# Patient Record
Sex: Female | Born: 1975 | Race: White | Hispanic: No | Marital: Married
Health system: Southern US, Community
[De-identification: ages and names within clinical notes are randomized; demographics above are authoritative.]

---

## 2007-08-02 ENCOUNTER — Emergency Department (HOSPITAL_COMMUNITY): Admission: EM | Admit: 2007-08-02 | Discharge: 2007-08-02 | Payer: Self-pay | Admitting: Family Medicine

## 2008-07-27 ENCOUNTER — Ambulatory Visit (HOSPITAL_COMMUNITY): Admission: RE | Admit: 2008-07-27 | Discharge: 2008-07-27 | Payer: Self-pay | Admitting: Family Medicine

## 2008-08-05 ENCOUNTER — Emergency Department (HOSPITAL_COMMUNITY): Admission: EM | Admit: 2008-08-05 | Discharge: 2008-08-05 | Payer: Self-pay | Admitting: Family Medicine

## 2008-09-24 ENCOUNTER — Ambulatory Visit (HOSPITAL_COMMUNITY): Admission: RE | Admit: 2008-09-24 | Discharge: 2008-09-24 | Payer: Self-pay | Admitting: Family Medicine

## 2008-12-17 ENCOUNTER — Ambulatory Visit: Payer: Self-pay | Admitting: Advanced Practice Midwife

## 2008-12-17 ENCOUNTER — Inpatient Hospital Stay (HOSPITAL_COMMUNITY): Admission: AD | Admit: 2008-12-17 | Discharge: 2008-12-18 | Payer: Self-pay | Admitting: Obstetrics & Gynecology

## 2008-12-27 ENCOUNTER — Inpatient Hospital Stay (HOSPITAL_COMMUNITY): Admission: AD | Admit: 2008-12-27 | Discharge: 2008-12-27 | Payer: Self-pay | Admitting: Obstetrics & Gynecology

## 2008-12-30 ENCOUNTER — Encounter: Payer: Self-pay | Admitting: Family Medicine

## 2008-12-30 LAB — CONVERTED CEMR LAB: Protein, Ur: 96 mg/24hr (ref 50–100)

## 2009-01-03 ENCOUNTER — Encounter: Payer: Self-pay | Admitting: Family Medicine

## 2009-01-06 ENCOUNTER — Ambulatory Visit (HOSPITAL_COMMUNITY): Admission: RE | Admit: 2009-01-06 | Discharge: 2009-01-06 | Payer: Self-pay | Admitting: Family Medicine

## 2009-02-06 ENCOUNTER — Ambulatory Visit: Payer: Self-pay | Admitting: Obstetrics & Gynecology

## 2009-02-06 ENCOUNTER — Inpatient Hospital Stay (HOSPITAL_COMMUNITY): Admission: AD | Admit: 2009-02-06 | Discharge: 2009-02-09 | Payer: Self-pay | Admitting: Family Medicine

## 2009-08-04 IMAGING — US US OB COMP LESS 14 WK
1 series · 14 of 23 positions shown · non-contrast
Comparison: none

OBSTETRICAL ULTRASOUND:
 This ultrasound exam was performed in the [HOSPITAL] Ultrasound Department.  The OB US report was generated in the AS system, and faxed to the ordering physician.  This report is also available in [REDACTED] PACS.

[Series 1: us ob comp less 14 wks · 14 of 23 slices shown]
[im 1/23]
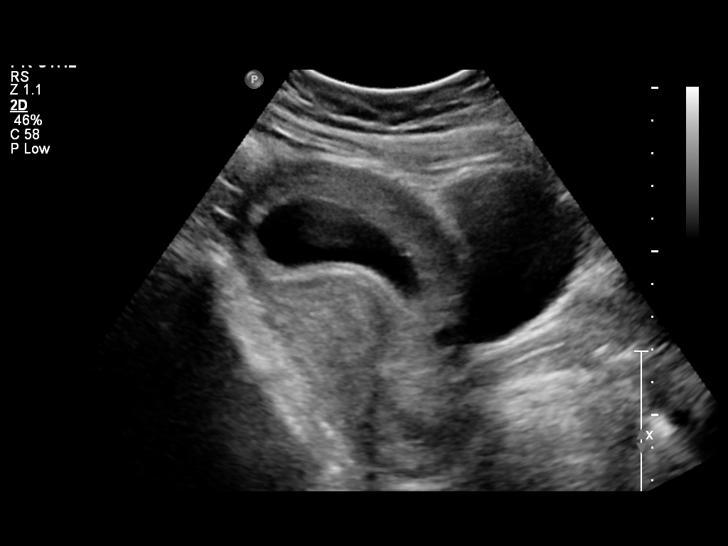
[im 3/23]
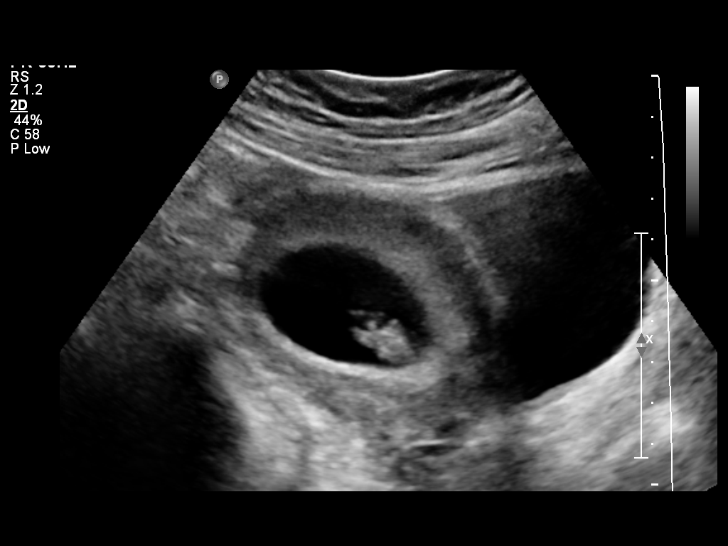
[im 5/23]
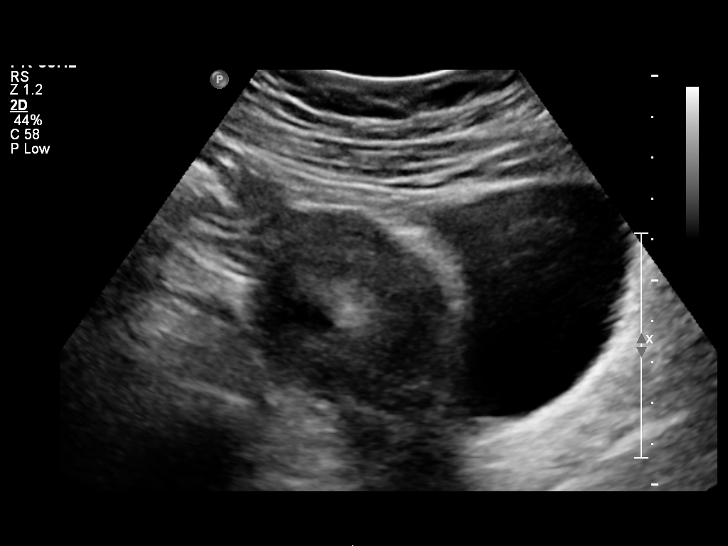
[im 6/23]
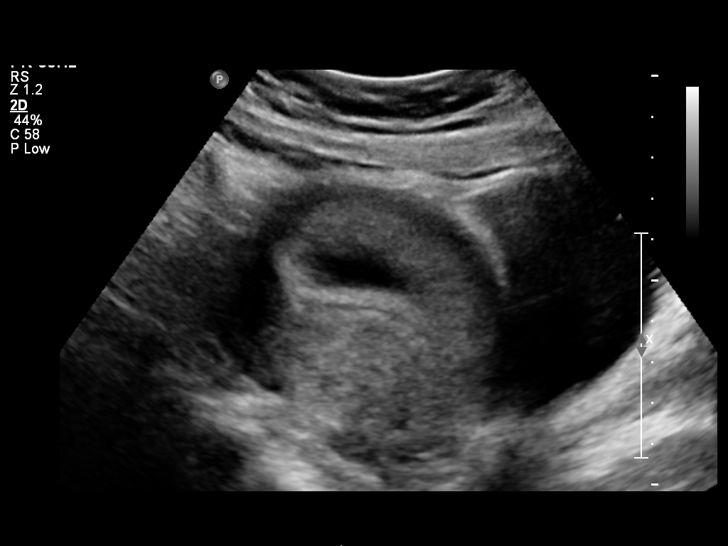
[im 8/23]
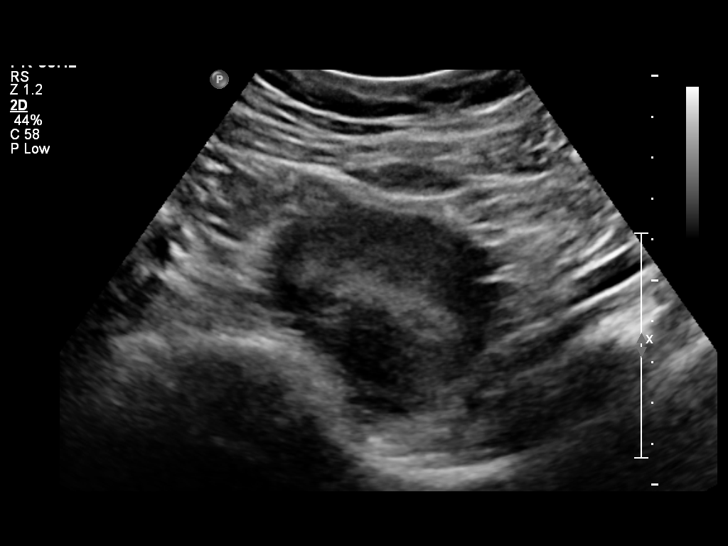
[im 10/23]
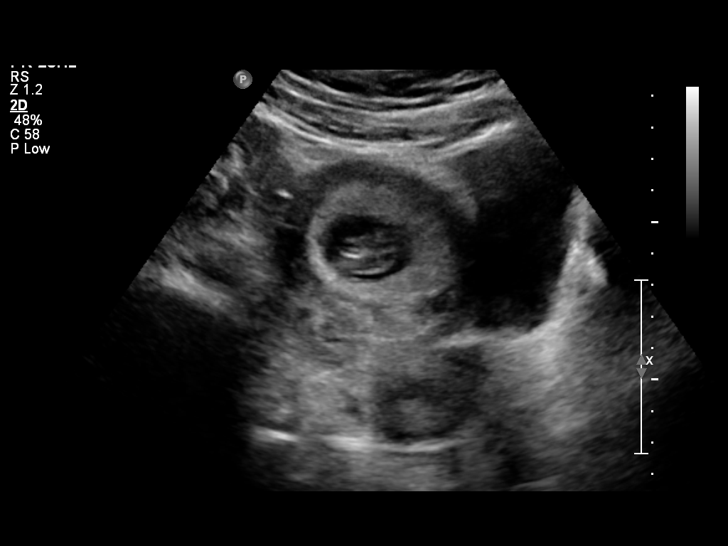
[im 11/23]
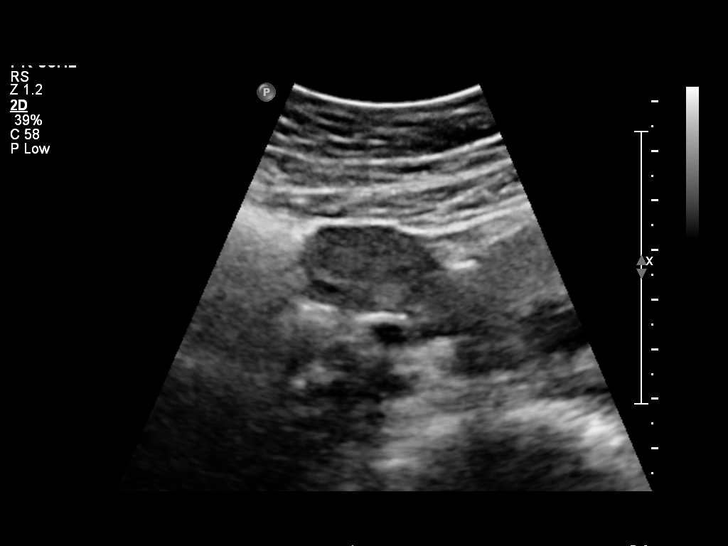
[im 13/23]
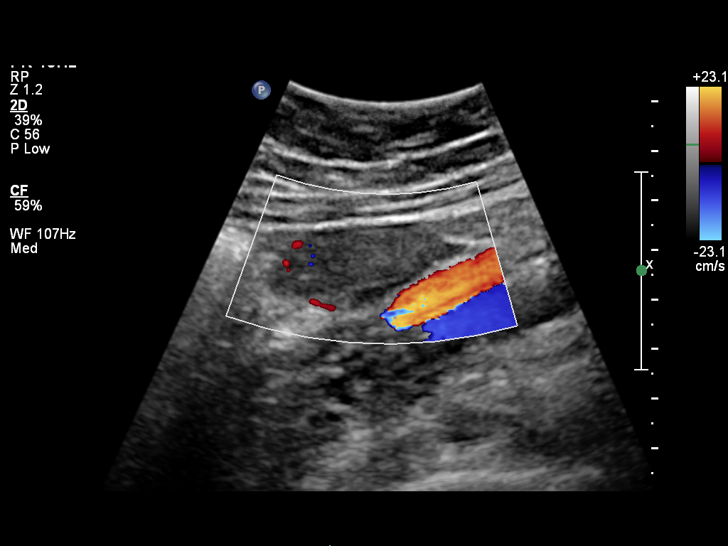
[im 14/23]
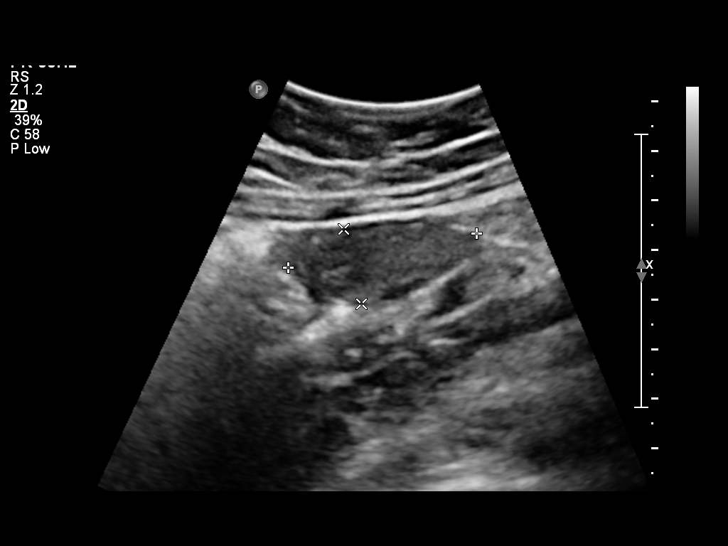
[im 16/23]
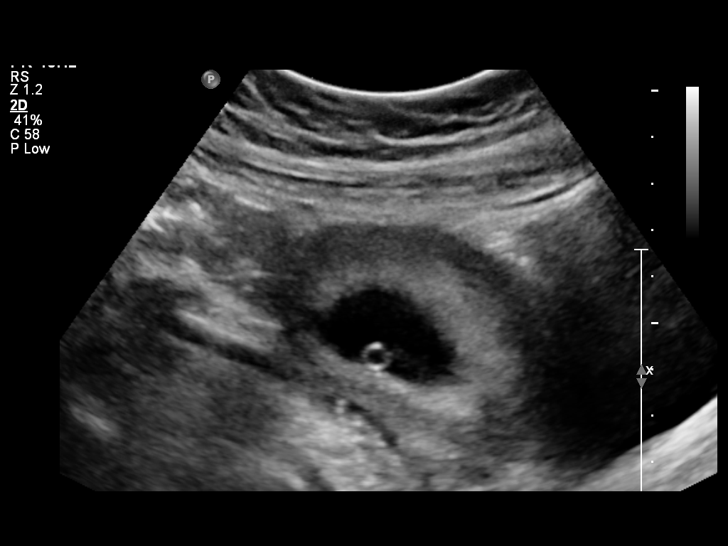
[im 18/23]
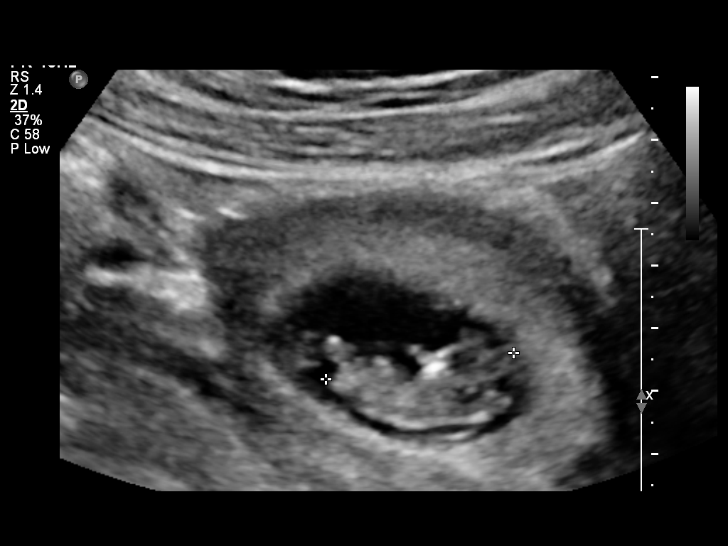
[im 19/23]
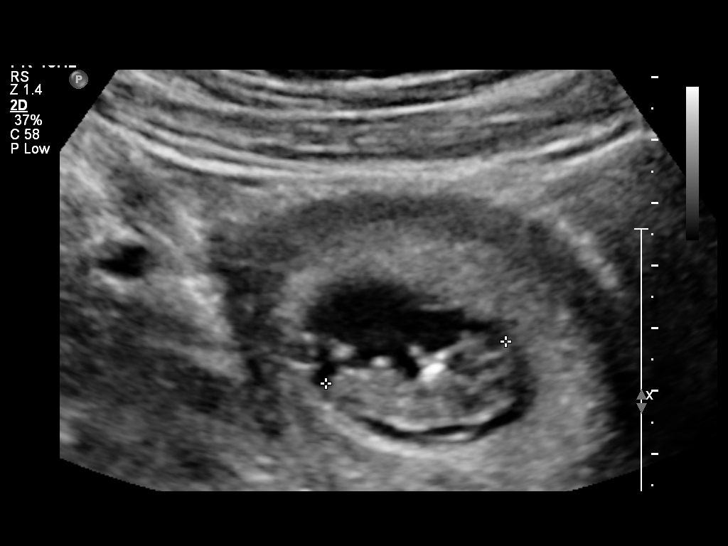
[im 21/23]
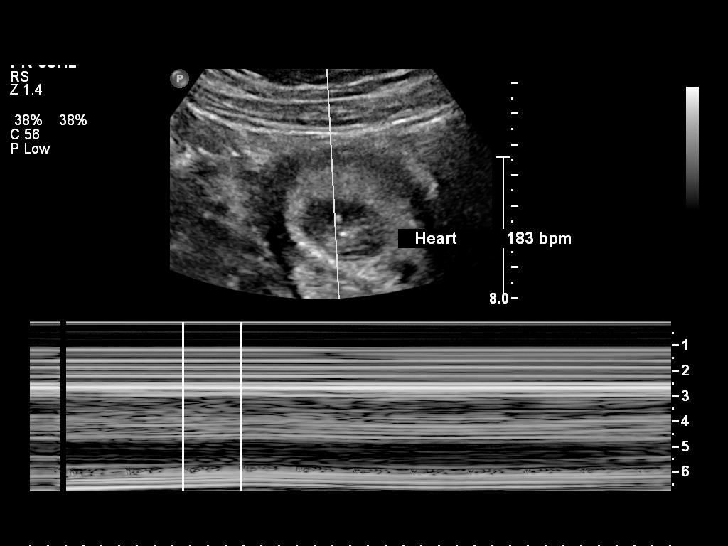
[im 23/23]
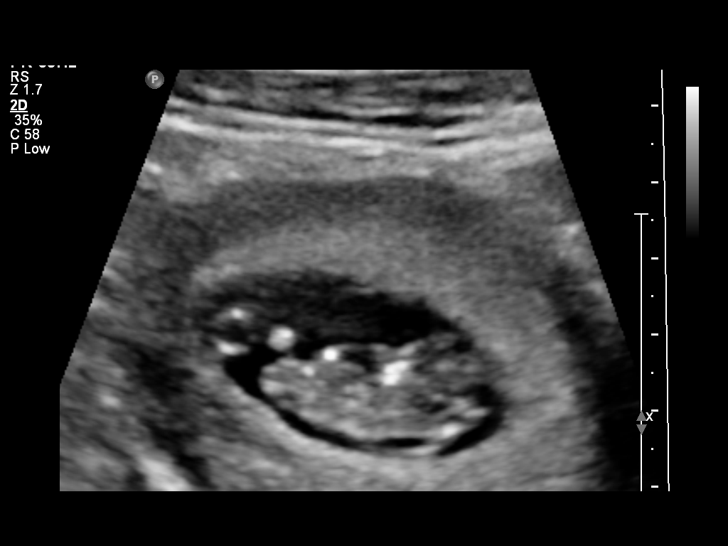

[14 of 23 positions shown; findings below may reference images not displayed]

IMPRESSION: See AS Obstetric US report.

## 2009-10-02 IMAGING — US US OB DETAIL+14 WK
2 of 3 series · 14 of 28 positions shown · non-contrast
Comparison: none

OBSTETRICAL ULTRASOUND:
 This ultrasound exam was performed in the [HOSPITAL] Ultrasound Department.  The OB US report was generated in the AS system, and faxed to the ordering physician.  This report is also available in [REDACTED] PACS.

[Series 1: us ob detail +14 wk · 1 of 5 slices shown (1 of 2)]
[im 5/5]
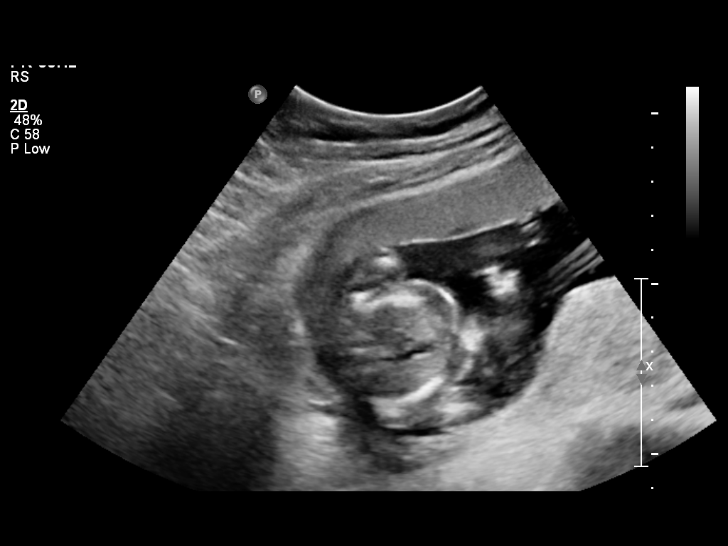

[Series 1: us ob detail +14 wk · 13 of 83 slices shown (2 of 2)]
[im 1/83]
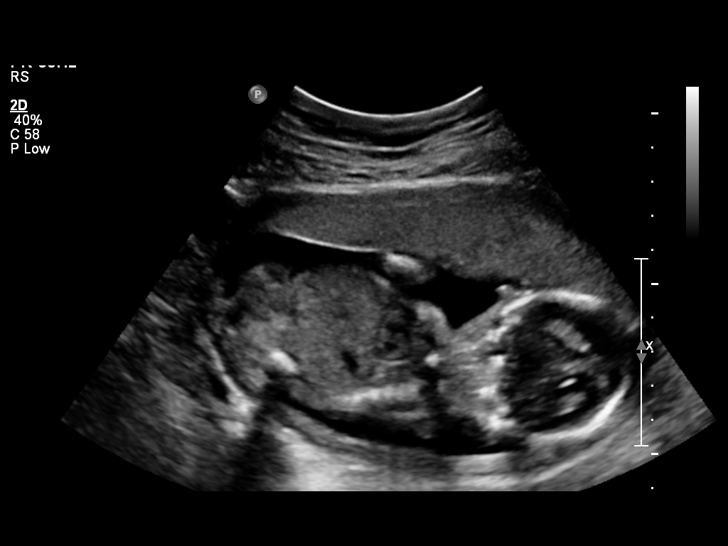
[im 7/83]
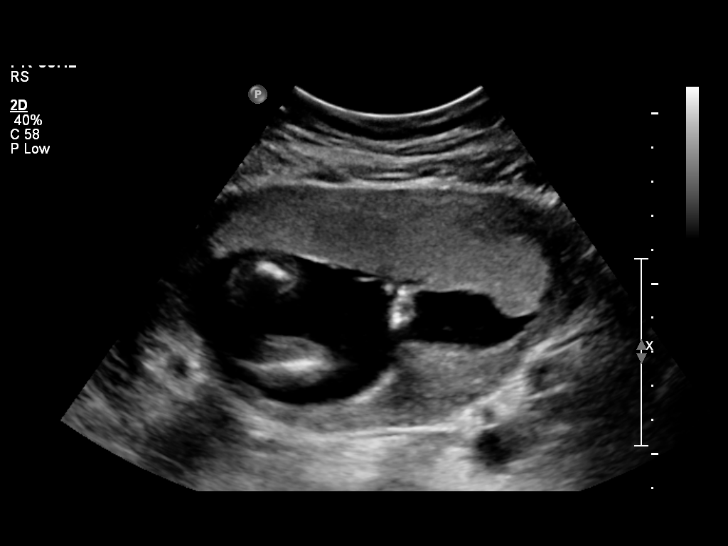
[im 14/83]
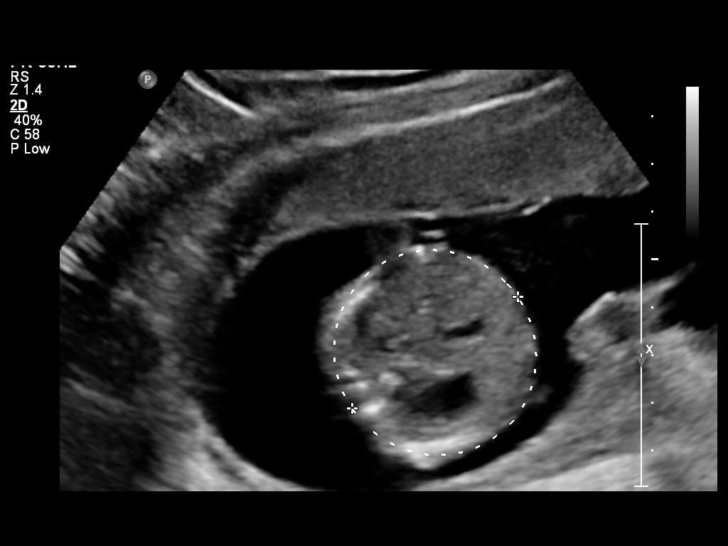
[im 21/83]
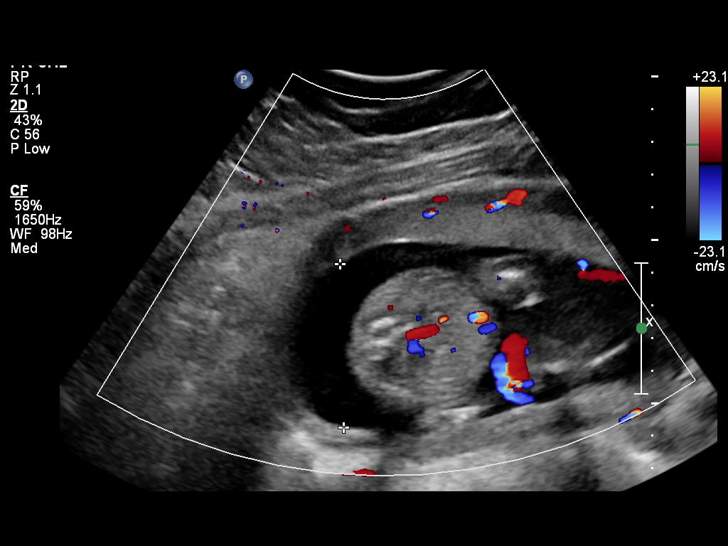
[im 28/83]
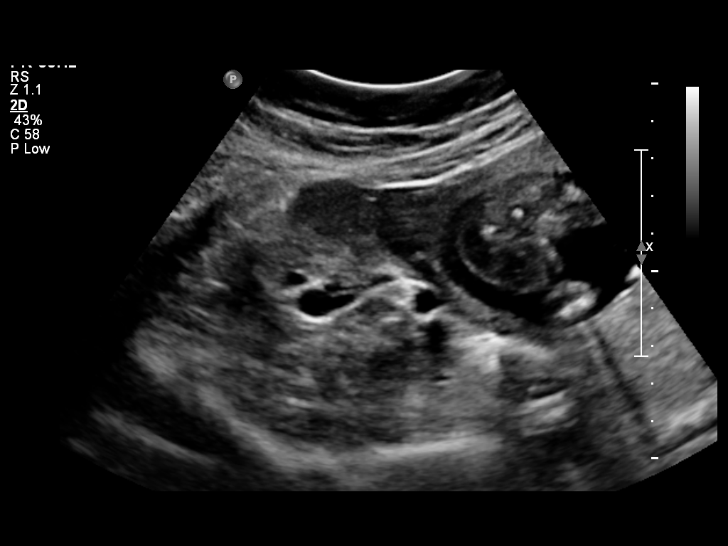
[im 35/83]
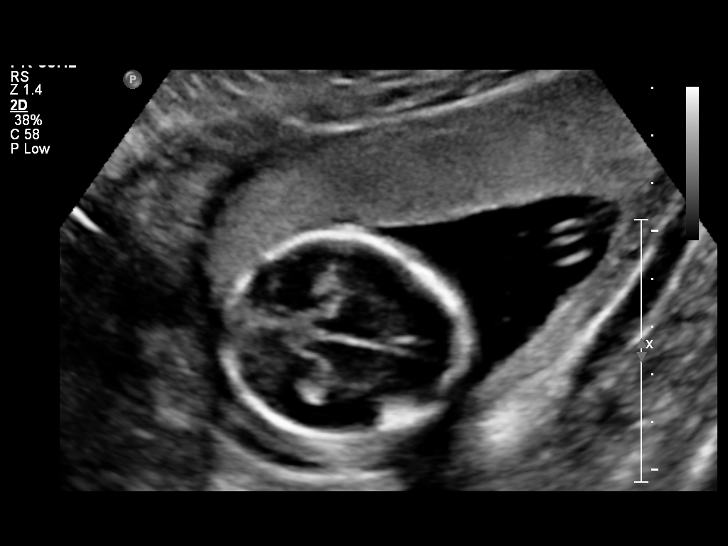
[im 42/83]
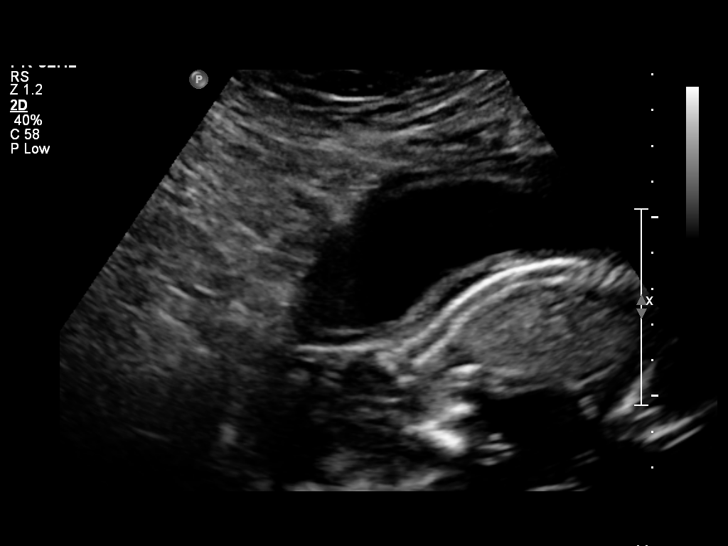
[im 48/83]
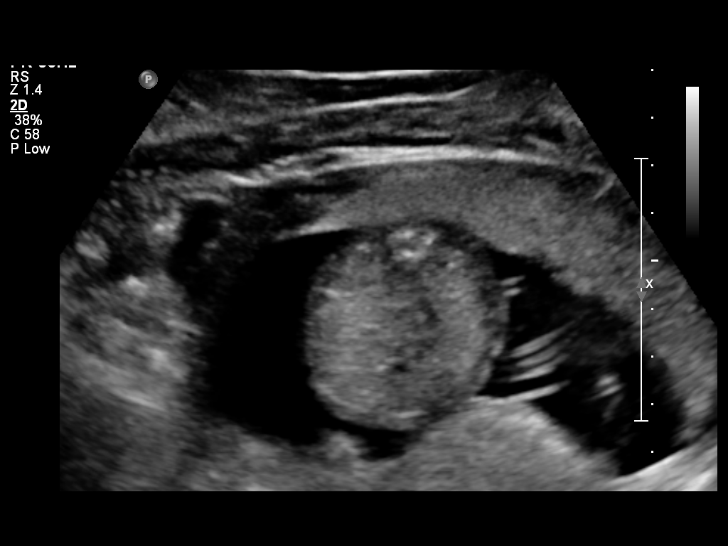
[im 55/83]
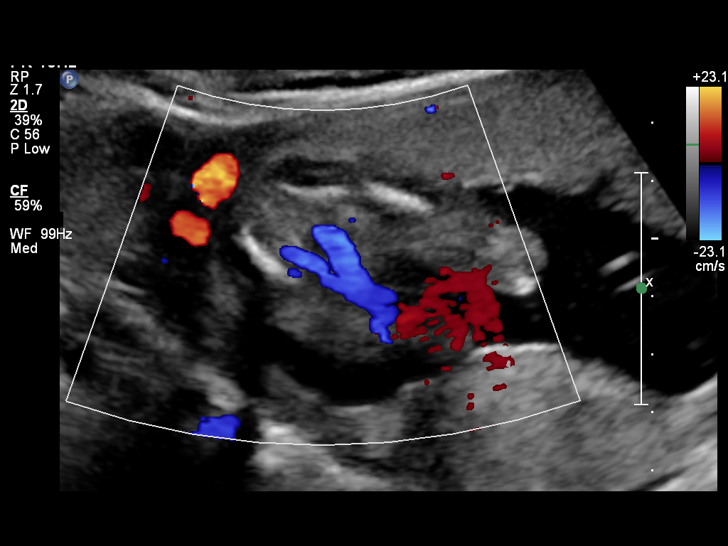
[im 62/83]
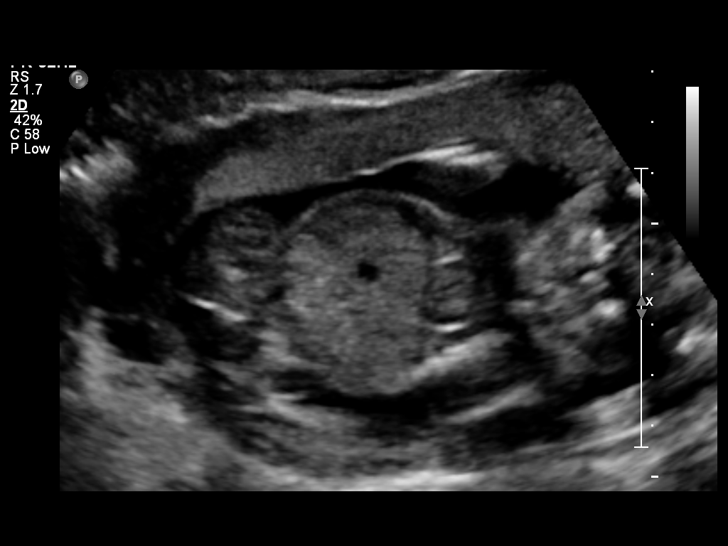
[im 69/83]
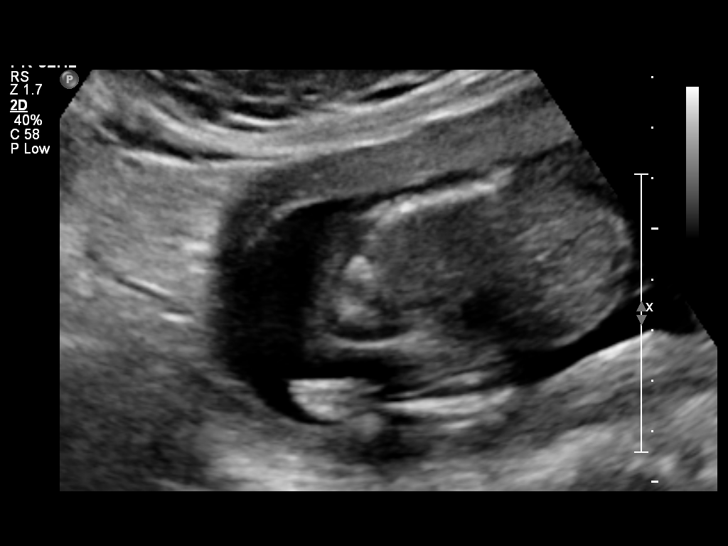
[im 76/83]
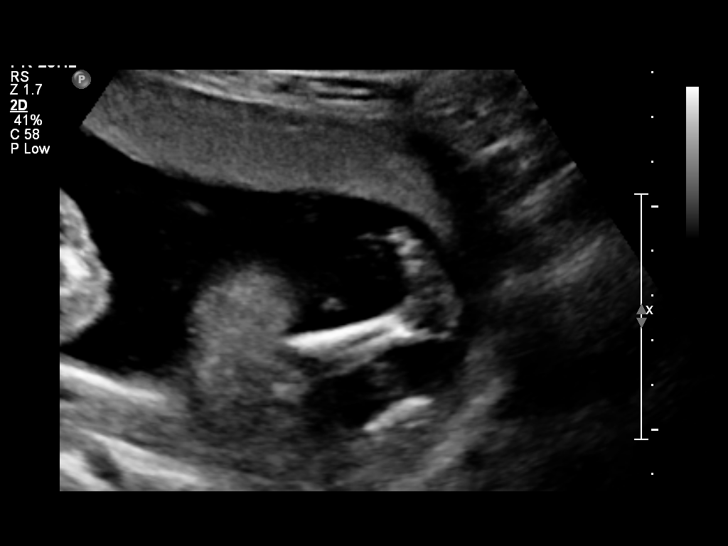
[im 83/83]
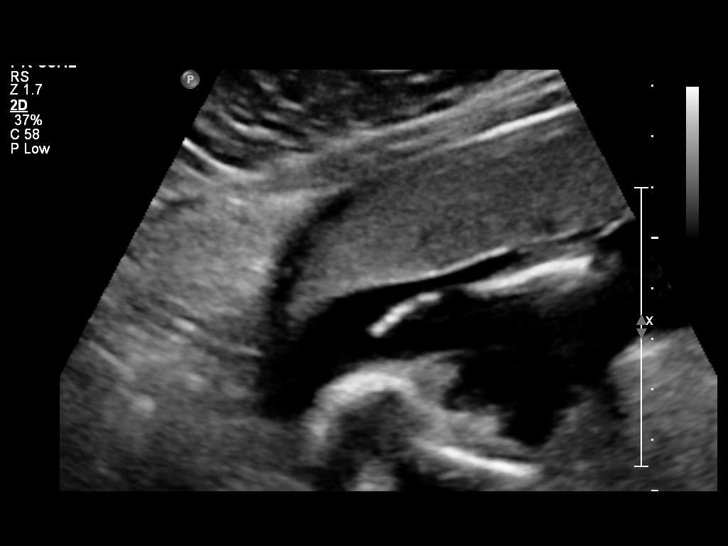

[14 of 28 positions shown; findings below may reference images not displayed]

IMPRESSION: See AS Obstetric US report.

## 2009-12-25 IMAGING — US US FETAL BPP W/O NONSTRESS
1 series · 14 of 17 positions shown · non-contrast
Comparison: none

OBSTETRICAL ULTRASOUND:
 This ultrasound exam was performed in the [HOSPITAL] Ultrasound Department.  The OB US report was generated in the AS system, and faxed to the ordering physician.  This report is also available in [REDACTED] PACS.

[Series 1: us fetal bpp w/o nonstress · non-contrast · 0.24mm/px · 17 acquisitions, 14 frames shown]
[im 1/17]
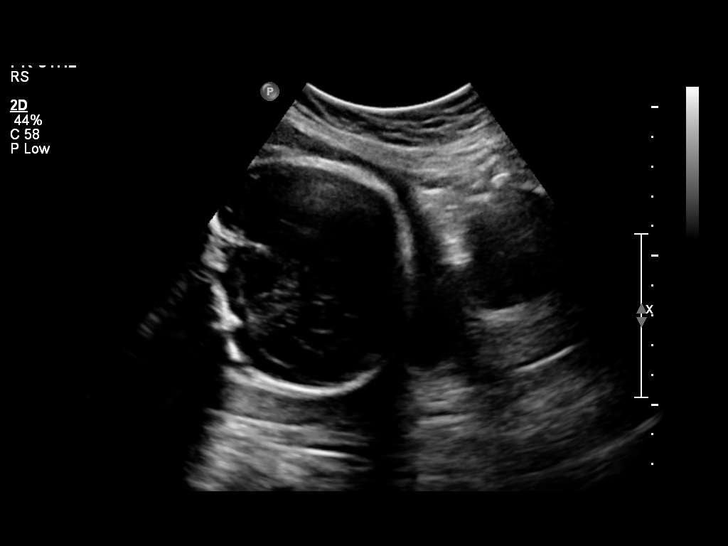
[im 2/17]
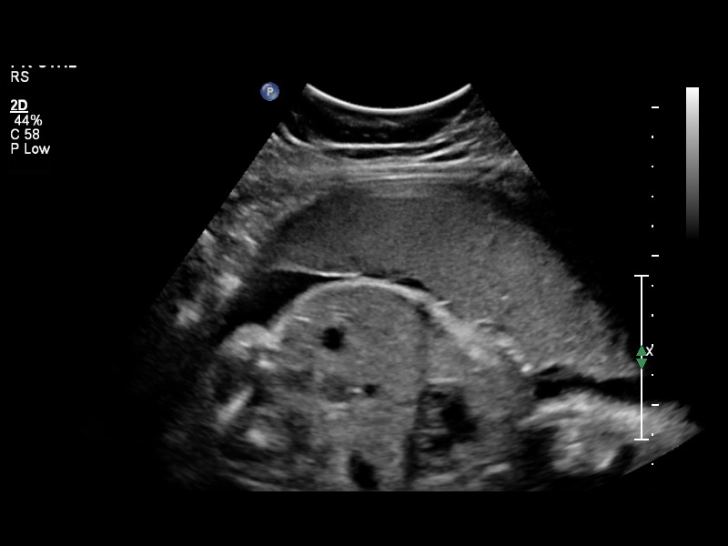
[im 4/17]
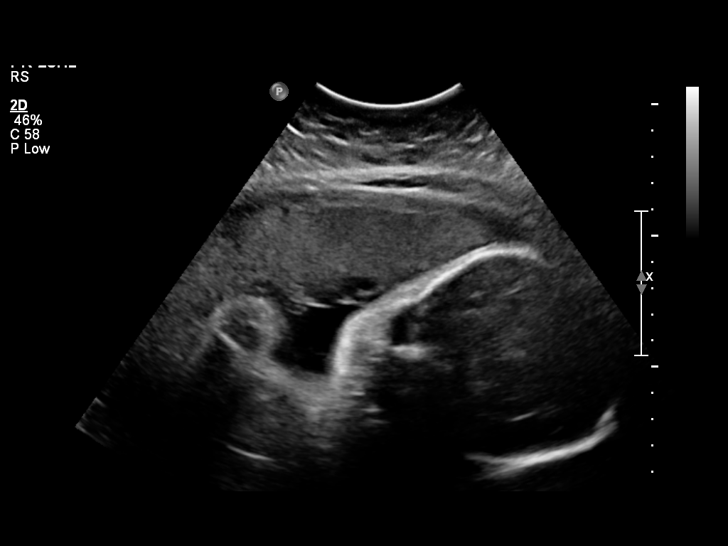
[im 5/17]
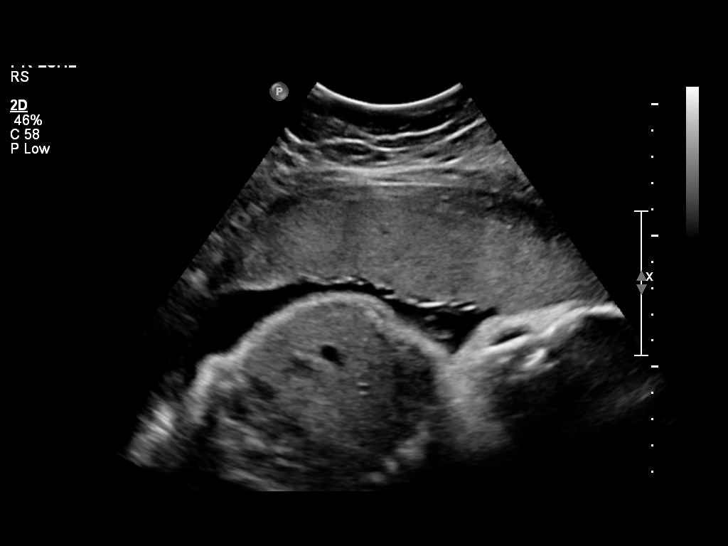
[im 6/17]
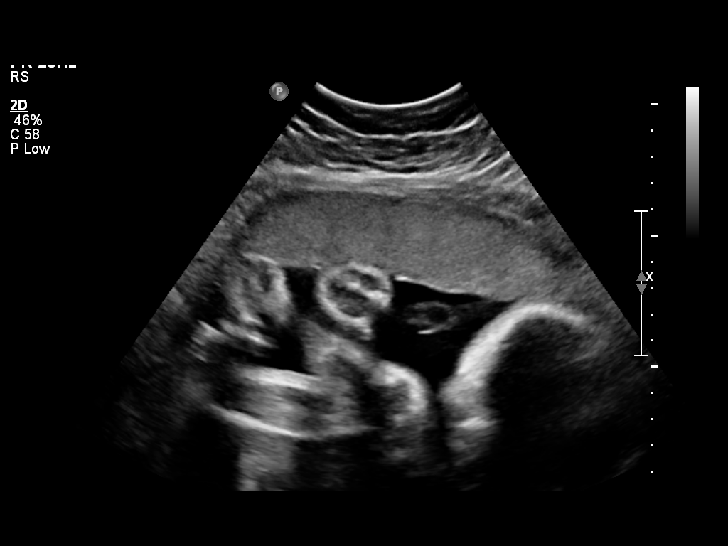
[im 7/17]
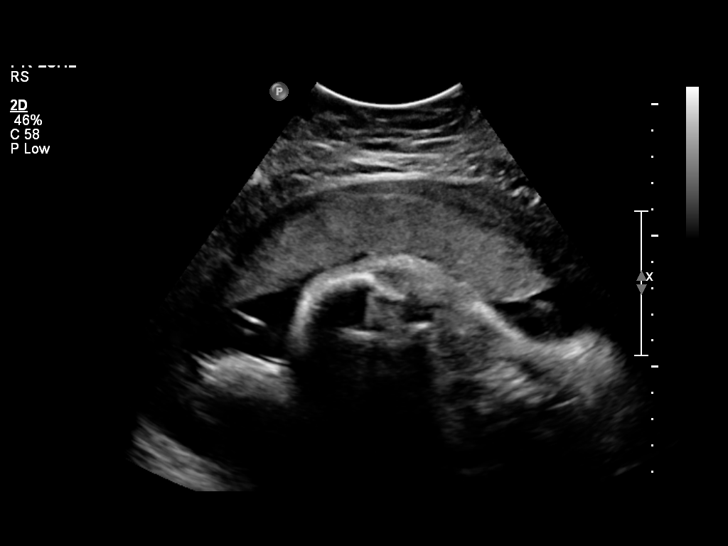
[im 8/17]
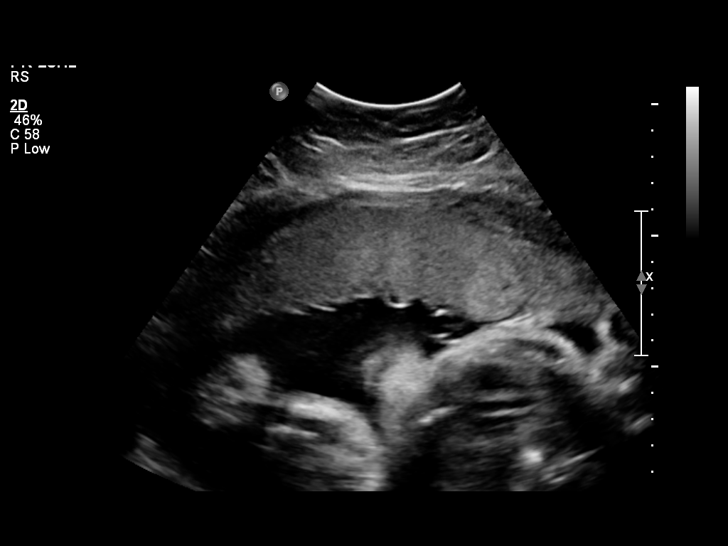
[im 10/17]
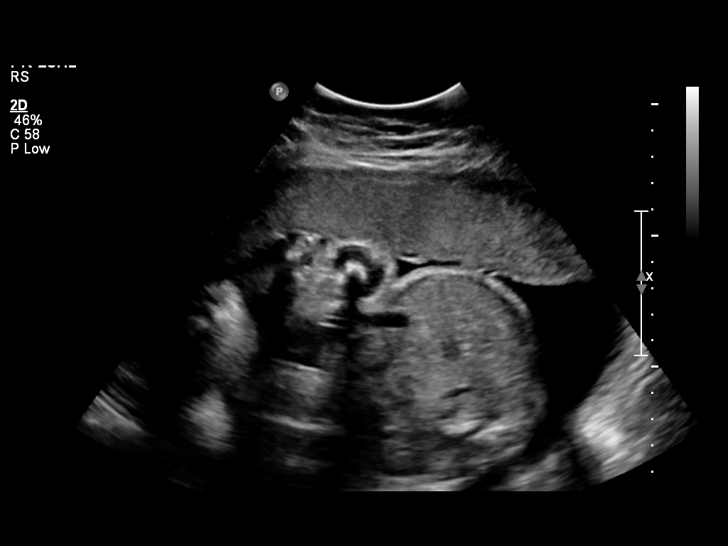
[im 11/17]
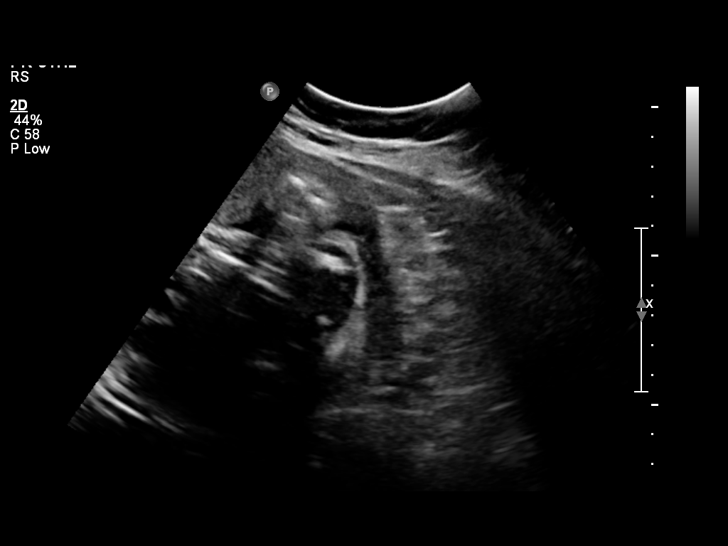
[im 12/17]
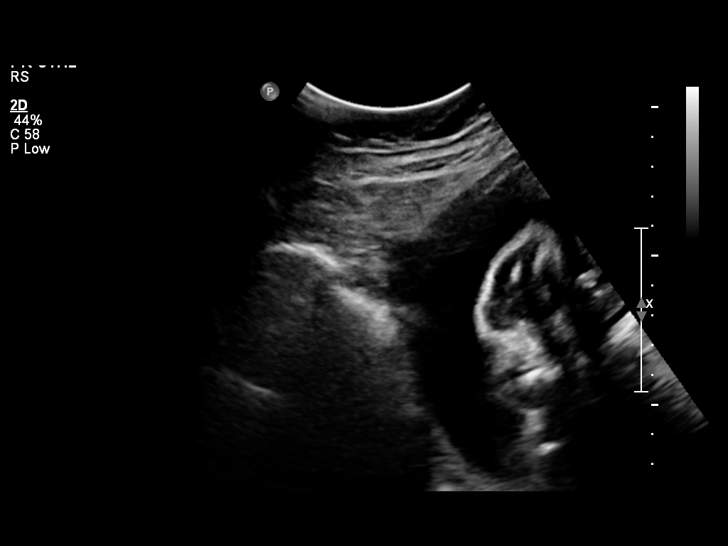
[im 13/17]
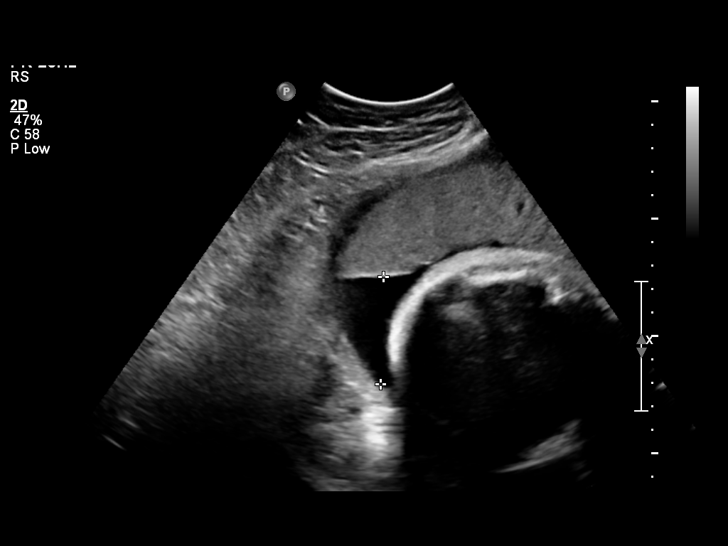
[im 14/17]
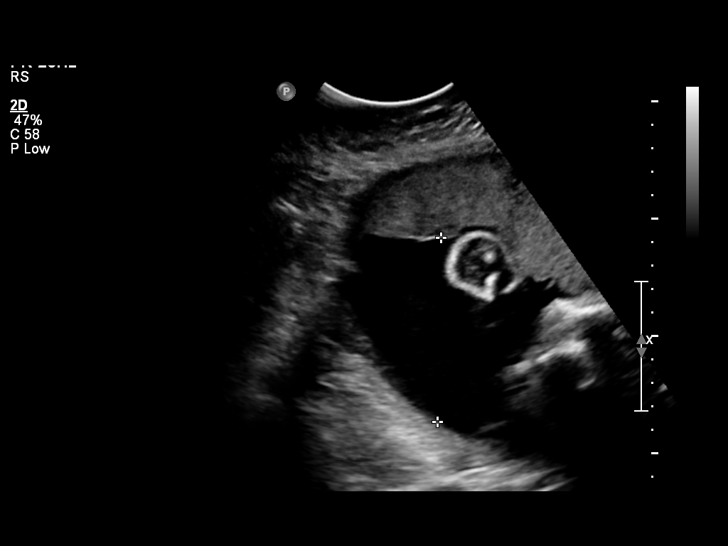
[im 16/17]
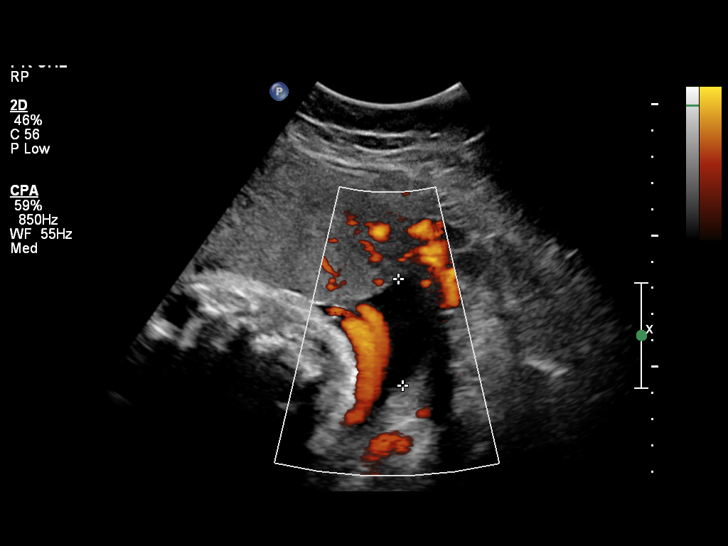
[im 17/17]
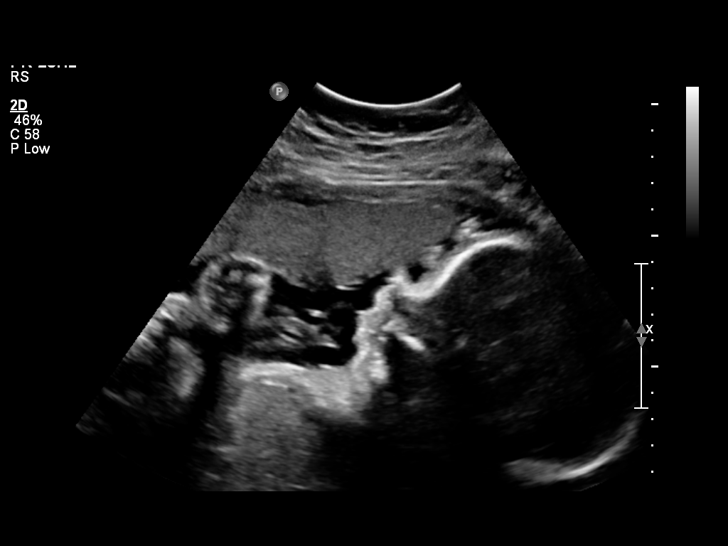

[14 of 17 positions shown; findings below may reference images not displayed]

IMPRESSION: See AS Obstetric US report.

## 2010-09-23 LAB — CBC
HCT: 37.4 % (ref 36.0–46.0)
Hemoglobin: 11.9 g/dL — ABNORMAL LOW (ref 12.0–15.0)
Hemoglobin: 12.8 g/dL (ref 12.0–15.0)
MCV: 88.7 fL (ref 78.0–100.0)
RBC: 3.86 MIL/uL — ABNORMAL LOW (ref 3.87–5.11)
RBC: 4.25 MIL/uL (ref 3.87–5.11)
WBC: 10.4 10*3/uL (ref 4.0–10.5)
WBC: 11.3 10*3/uL — ABNORMAL HIGH (ref 4.0–10.5)

## 2010-09-23 LAB — URINALYSIS, ROUTINE W REFLEX MICROSCOPIC
Bilirubin Urine: NEGATIVE
Glucose, UA: NEGATIVE mg/dL
Ketones, ur: 15 mg/dL — AB
Nitrite: NEGATIVE
pH: 7 (ref 5.0–8.0)

## 2010-09-23 LAB — COMPREHENSIVE METABOLIC PANEL
AST: 26 U/L (ref 0–37)
Albumin: 2.6 g/dL — ABNORMAL LOW (ref 3.5–5.2)
CO2: 22 mEq/L (ref 19–32)
Calcium: 8.9 mg/dL (ref 8.4–10.5)
Creatinine, Ser: 0.51 mg/dL (ref 0.4–1.2)
GFR calc Af Amer: >60 mL/min (ref >60)
GFR calc non Af Amer: >60 mL/min (ref >60)
Total Protein: 5.9 g/dL — ABNORMAL LOW (ref 6.0–8.3)

## 2010-09-23 LAB — URINE MICROSCOPIC-ADD ON

## 2010-09-24 LAB — COMPREHENSIVE METABOLIC PANEL
Alkaline Phosphatase: 78 U/L (ref 39–117)
BUN: 4 mg/dL — ABNORMAL LOW (ref 6–23)
CO2: 24 mEq/L (ref 19–32)
Chloride: 106 mEq/L (ref 96–112)
Glucose, Bld: 102 mg/dL — ABNORMAL HIGH (ref 70–99)
Potassium: 4.1 mEq/L (ref 3.5–5.1)
Total Bilirubin: 0.4 mg/dL (ref 0.3–1.2)

## 2010-09-24 LAB — URINALYSIS, ROUTINE W REFLEX MICROSCOPIC
Glucose, UA: NEGATIVE mg/dL
Ketones, ur: 15 mg/dL — AB
Ketones, ur: NEGATIVE mg/dL
Nitrite: NEGATIVE
Protein, ur: NEGATIVE mg/dL
pH: 7.5 (ref 5.0–8.0)

## 2010-09-24 LAB — GC/CHLAMYDIA PROBE AMP, GENITAL: GC Probe Amp, Genital: NEGATIVE

## 2010-09-24 LAB — CBC
HCT: 35.5 % — ABNORMAL LOW (ref 36.0–46.0)
Hemoglobin: 12.4 g/dL (ref 12.0–15.0)
WBC: 12.3 10*3/uL — ABNORMAL HIGH (ref 4.0–10.5)

## 2010-09-24 LAB — URIC ACID: Uric Acid, Serum: 2.2 mg/dL — ABNORMAL LOW (ref 2.4–7.0)

## 2010-09-24 LAB — WET PREP, GENITAL: Yeast Wet Prep HPF POC: NONE SEEN

## 2010-09-24 LAB — STREP B DNA PROBE

## 2010-09-24 LAB — FETAL FIBRONECTIN: Fetal Fibronectin: NEGATIVE

## 2010-10-31 NOTE — Discharge Summary (Signed)
NAME:  Kara Lowe, Kara Lowe NO.:  [id number]   MEDICAL RECORD NO.:  [id number]          PATIENT TYPE:  WOC   LOCATION:  WOC                          FACILITY:  [facility]   PHYSICIAN:  Horton Chin, MD DATE OF BIRTH:  12-Jan-1976   DATE OF ADMISSION:  DATE OF DISCHARGE:  02/09/2009                               DISCHARGE SUMMARY   PRIMARY OB Myliah Medel:  Dr. Hal Morales, [place], [place].  Phone  number [phone].   DISCHARGE DIAGNOSES:  1. Term pregnancy with normal spontaneous vaginal delivery.  2. Pre-eclampsia.   DISCHARGE MEDICATIONS:  1. Prenatal vitamins.  2. Norvasc 5 mg one tablet p.o. b.i.d.  3. HCTZ 25 mg one tab p.o. daily.   CONSULTATIONS:  None.   PROCEDURES:  None.   LABS:  CBC, February 07, 2009:  White count 10.4, hemoglobin 12.8,  hematocrit 37.4, platelets 248.  CMP, February 06, 2009:  Sodium 138,  potassium 3.5, chloride 108, CO2 22, BUN 3, creatinine 0.51, blood  glucose of 89.  Total bilirubin 0.3, alkaline phosphatase 150, AST 26,  ALT 17, total protein 5.9, albumin 2.6, calcium 8.9.  Uric acid, February 07, 2009, 2.5.  Urinalysis February 07, 2009:  Yellow color, clear  appearance, specific gravity less than 1.005, pH 7.  Urine glucose  negative, bilirubin negative, ketone 15, trace blood, negative protein,  urobilinogen 0.2, nitrite negative, leukocyte negative.  RPR, February 07, 2009, nonreactive.  CBC, February 08, 2009:  White count 11.3, hemoglobin  11.9, hematocrit 34.3, platelet count 238.   BRIEF HOSPITAL COURSE:  This is a 35 year old G1, P1 at 2 who presented  in labor and went on to normal spontaneous vaginal delivery with  epidural anesthesia, three-vessel cord, spontaneous placenta, EBL 350  mL, second-degree laceration repaired with 3-0 Vicryl suture.  Apgars 6  and 9 at one and five minutes.  Mom was breast-feeding with IUD for  birth control and GBS negative, B-positive, antibody negative,  hemoglobin surface  antigen negative, HIV-negative, RPR nonreactive.   Pre-eclampsia:  The patient was placed on magnesium per pre-eclampsia  protocol.  Given patient did have close followup with her primary care  Nahsir Venezia at [facility]; however, the patient was unable to deliver with  her primary obstetrician because of her doctor losing privileges at the  local hospital, and patient was therefore referred to the [facility], to which the patient has been very grateful.  The patient was continued on magnesium postpartum with systolic pressure  remaining in the 130s.  After magnesium was discontinued, the patient's  blood pressure increased in range from the 140s to 160s.  Subsequently,  patient was placed on HCTZ 25 mg p.o. daily, as well as Norvasc 5 mg  p.o. b.i.d. for blood pressure management.  Status post administration  of these medications, the patient's blood pressure remained in the 120s  to 150s/60s to 90s.  The patient remained clinically asymptomatic  throughout hospitalization with no reports of deep tendon reflex  abnormalities from magnesium administration or any complaints of  headache, dizziness or any other  symptomatology related to postpartum  pre-eclampsia or hypertension.   DISCHARGE INSTRUCTIONS:  The patient is discharged with a normal diet,  no wound care, normal activity with no sexual activity for at least six  weeks.   FOLLOWUP APPOINTMENTS:  1. The patient is to follow up with her primary care Lizandro Spellman in      [facility] in four to six weeks for routine postpartum care.  2. The patient will need to follow up with her primary care Nolia Tschantz      in the next week for prompt management of her blood pressure, as      the patient has been placed on a new antihypertensive medication      regimen.   DISCHARGE CONDITION:  Patient is discharged home in stable medical  condition.      Doree Albee, MD      Horton Chin, MD  Electronically  Signed    SN/MEDQ  D:  02/09/2009  T:  02/09/2009  Job:  [id number]

## 2011-03-09 LAB — INFLUENZA A AND B ANTIGEN (CONVERTED LAB): Influenza B Ag: NEGATIVE

## 2013-08-04 ENCOUNTER — Other Ambulatory Visit: Payer: Self-pay | Admitting: Obstetrics and Gynecology

## 2013-08-04 ENCOUNTER — Other Ambulatory Visit (HOSPITAL_COMMUNITY)
Admission: RE | Admit: 2013-08-04 | Discharge: 2013-08-04 | Disposition: A | Discharge status: Home / Self Care | Payer: 59 | Source: Ambulatory Visit | Attending: Obstetrics and Gynecology | Admitting: Obstetrics and Gynecology

## 2013-08-04 DIAGNOSIS — Z1151 Encounter for screening for human papillomavirus (HPV): Secondary | ICD-10-CM | POA: Insufficient documentation

## 2013-08-04 DIAGNOSIS — Z01419 Encounter for gynecological examination (general) (routine) without abnormal findings: Secondary | ICD-10-CM | POA: Insufficient documentation

## 2019-09-04 DIAGNOSIS — J0110 Acute frontal sinusitis, unspecified: Secondary | ICD-10-CM | POA: Diagnosis not present

## 2019-09-30 ENCOUNTER — Ambulatory Visit: Payer: Self-pay

## 2019-12-31 DIAGNOSIS — Z01419 Encounter for gynecological examination (general) (routine) without abnormal findings: Secondary | ICD-10-CM | POA: Diagnosis not present

## 2019-12-31 DIAGNOSIS — K589 Irritable bowel syndrome without diarrhea: Secondary | ICD-10-CM | POA: Diagnosis not present

## 2019-12-31 DIAGNOSIS — Z789 Other specified health status: Secondary | ICD-10-CM | POA: Diagnosis not present

## 2019-12-31 DIAGNOSIS — R030 Elevated blood-pressure reading, without diagnosis of hypertension: Secondary | ICD-10-CM | POA: Diagnosis not present

## 2020-01-25 DIAGNOSIS — H18462 Peripheral corneal degeneration, left eye: Secondary | ICD-10-CM | POA: Diagnosis not present

## 2020-01-25 DIAGNOSIS — H5213 Myopia, bilateral: Secondary | ICD-10-CM | POA: Diagnosis not present

## 2020-03-03 DIAGNOSIS — Z23 Encounter for immunization: Secondary | ICD-10-CM | POA: Diagnosis not present

## 2020-03-03 DIAGNOSIS — I10 Essential (primary) hypertension: Secondary | ICD-10-CM | POA: Diagnosis not present

## 2020-03-03 DIAGNOSIS — Z0000 Encounter for general adult medical examination without abnormal findings: Secondary | ICD-10-CM | POA: Diagnosis not present

## 2020-03-03 DIAGNOSIS — M25512 Pain in left shoulder: Secondary | ICD-10-CM | POA: Diagnosis not present

## 2020-03-03 DIAGNOSIS — K589 Irritable bowel syndrome without diarrhea: Secondary | ICD-10-CM | POA: Diagnosis not present

## 2020-03-03 DIAGNOSIS — Z13220 Encounter for screening for lipoid disorders: Secondary | ICD-10-CM | POA: Diagnosis not present

## 2020-03-03 DIAGNOSIS — F411 Generalized anxiety disorder: Secondary | ICD-10-CM | POA: Diagnosis not present

## 2020-03-09 DIAGNOSIS — R030 Elevated blood-pressure reading, without diagnosis of hypertension: Secondary | ICD-10-CM | POA: Diagnosis not present

## 2020-03-09 DIAGNOSIS — Z124 Encounter for screening for malignant neoplasm of cervix: Secondary | ICD-10-CM | POA: Diagnosis not present

## 2020-03-09 DIAGNOSIS — N943 Premenstrual tension syndrome: Secondary | ICD-10-CM | POA: Diagnosis not present

## 2020-03-10 DIAGNOSIS — Z124 Encounter for screening for malignant neoplasm of cervix: Secondary | ICD-10-CM | POA: Diagnosis not present

## 2020-03-24 DIAGNOSIS — M79642 Pain in left hand: Secondary | ICD-10-CM | POA: Diagnosis not present

## 2020-03-24 DIAGNOSIS — M25512 Pain in left shoulder: Secondary | ICD-10-CM | POA: Diagnosis not present

## 2020-04-20 DIAGNOSIS — Z1231 Encounter for screening mammogram for malignant neoplasm of breast: Secondary | ICD-10-CM | POA: Diagnosis not present

## 2020-05-09 ENCOUNTER — Telehealth: Payer: Self-pay

## 2020-05-09 NOTE — Telephone Encounter (Signed)
NOTES ON FILE FROM [facility] [phone], SENT REFERRAL TO SCHEDULING 

## 2020-06-23 DIAGNOSIS — Z1159 Encounter for screening for other viral diseases: Secondary | ICD-10-CM | POA: Diagnosis not present

## 2020-06-27 ENCOUNTER — Ambulatory Visit: Payer: Self-pay | Admitting: Cardiovascular Disease

## 2020-06-28 DIAGNOSIS — F419 Anxiety disorder, unspecified: Secondary | ICD-10-CM | POA: Diagnosis not present

## 2020-06-28 DIAGNOSIS — F410 Panic disorder [episodic paroxysmal anxiety] without agoraphobia: Secondary | ICD-10-CM | POA: Diagnosis not present

## 2020-06-29 ENCOUNTER — Telehealth: Payer: Self-pay | Admitting: *Deleted

## 2020-07-01 NOTE — Telephone Encounter (Signed)
LATE ENTRY -- Spoke with patient 1/12 to reschedule her ADV HTN VISIT to virtual visit today. Patient declined, she saw her PCP and was started on new medication and would like to keep February appointment as scheduled. She would like to see how this works first

## 2020-07-25 ENCOUNTER — Ambulatory Visit: Payer: Self-pay | Admitting: Cardiovascular Disease

## 2020-08-03 DIAGNOSIS — R1084 Generalized abdominal pain: Secondary | ICD-10-CM | POA: Diagnosis not present

## 2020-08-03 DIAGNOSIS — Z8719 Personal history of other diseases of the digestive system: Secondary | ICD-10-CM | POA: Diagnosis not present

## 2020-08-03 DIAGNOSIS — R140 Abdominal distension (gaseous): Secondary | ICD-10-CM | POA: Diagnosis not present

## 2020-08-03 DIAGNOSIS — R194 Change in bowel habit: Secondary | ICD-10-CM | POA: Diagnosis not present

## 2020-08-21 DIAGNOSIS — S61211A Laceration without foreign body of left index finger without damage to nail, initial encounter: Secondary | ICD-10-CM | POA: Diagnosis not present

## 2020-09-01 DIAGNOSIS — R030 Elevated blood-pressure reading, without diagnosis of hypertension: Secondary | ICD-10-CM | POA: Diagnosis not present

## 2020-09-01 DIAGNOSIS — Z4889 Encounter for other specified surgical aftercare: Secondary | ICD-10-CM | POA: Diagnosis not present

## 2020-09-01 DIAGNOSIS — F419 Anxiety disorder, unspecified: Secondary | ICD-10-CM | POA: Diagnosis not present

## 2020-10-26 DIAGNOSIS — R140 Abdominal distension (gaseous): Secondary | ICD-10-CM | POA: Diagnosis not present

## 2020-10-26 DIAGNOSIS — R1084 Generalized abdominal pain: Secondary | ICD-10-CM | POA: Diagnosis not present

## 2020-10-26 DIAGNOSIS — K5900 Constipation, unspecified: Secondary | ICD-10-CM | POA: Diagnosis not present

## 2020-10-26 DIAGNOSIS — R197 Diarrhea, unspecified: Secondary | ICD-10-CM | POA: Diagnosis not present

## 2020-10-26 DIAGNOSIS — K6389 Other specified diseases of intestine: Secondary | ICD-10-CM | POA: Diagnosis not present

## 2020-10-26 DIAGNOSIS — R109 Unspecified abdominal pain: Secondary | ICD-10-CM | POA: Diagnosis not present

## 2020-12-28 DIAGNOSIS — I10 Essential (primary) hypertension: Secondary | ICD-10-CM | POA: Diagnosis not present

## 2021-03-01 DIAGNOSIS — Z01419 Encounter for gynecological examination (general) (routine) without abnormal findings: Secondary | ICD-10-CM | POA: Diagnosis not present

## 2021-03-15 DIAGNOSIS — E781 Pure hyperglyceridemia: Secondary | ICD-10-CM | POA: Diagnosis not present

## 2021-03-15 DIAGNOSIS — I10 Essential (primary) hypertension: Secondary | ICD-10-CM | POA: Diagnosis not present

## 2021-03-15 DIAGNOSIS — B009 Herpesviral infection, unspecified: Secondary | ICD-10-CM | POA: Diagnosis not present

## 2021-03-15 DIAGNOSIS — F411 Generalized anxiety disorder: Secondary | ICD-10-CM | POA: Diagnosis not present

## 2021-03-30 DIAGNOSIS — K582 Mixed irritable bowel syndrome: Secondary | ICD-10-CM | POA: Diagnosis not present

## 2021-04-26 DIAGNOSIS — Z1231 Encounter for screening mammogram for malignant neoplasm of breast: Secondary | ICD-10-CM | POA: Diagnosis not present

## 2021-06-17 ENCOUNTER — Other Ambulatory Visit (INDEPENDENT_AMBULATORY_CARE_PROVIDER_SITE_OTHER): Payer: Self-pay | Admitting: Adult Health

## 2021-06-17 DIAGNOSIS — G8929 Other chronic pain: Secondary | ICD-10-CM

## 2021-08-18 DIAGNOSIS — R059 Cough, unspecified: Secondary | ICD-10-CM | POA: Diagnosis not present

## 2021-08-18 DIAGNOSIS — R6889 Other general symptoms and signs: Secondary | ICD-10-CM | POA: Diagnosis not present

## 2021-08-18 DIAGNOSIS — R52 Pain, unspecified: Secondary | ICD-10-CM | POA: Diagnosis not present

## 2021-09-20 DIAGNOSIS — E781 Pure hyperglyceridemia: Secondary | ICD-10-CM | POA: Diagnosis not present

## 2021-09-20 DIAGNOSIS — I10 Essential (primary) hypertension: Secondary | ICD-10-CM | POA: Diagnosis not present

## 2021-09-20 DIAGNOSIS — K589 Irritable bowel syndrome without diarrhea: Secondary | ICD-10-CM | POA: Diagnosis not present

## 2021-09-20 DIAGNOSIS — F411 Generalized anxiety disorder: Secondary | ICD-10-CM | POA: Diagnosis not present

## 2021-11-21 DIAGNOSIS — H18462 Peripheral corneal degeneration, left eye: Secondary | ICD-10-CM | POA: Diagnosis not present

## 2022-02-12 DIAGNOSIS — H18462 Peripheral corneal degeneration, left eye: Secondary | ICD-10-CM | POA: Diagnosis not present

## 2022-03-07 DIAGNOSIS — Z01419 Encounter for gynecological examination (general) (routine) without abnormal findings: Secondary | ICD-10-CM | POA: Diagnosis not present

## 2022-03-15 DIAGNOSIS — I10 Essential (primary) hypertension: Secondary | ICD-10-CM | POA: Diagnosis not present

## 2022-03-15 DIAGNOSIS — E669 Obesity, unspecified: Secondary | ICD-10-CM | POA: Diagnosis not present

## 2022-03-15 DIAGNOSIS — U071 COVID-19: Secondary | ICD-10-CM | POA: Diagnosis not present

## 2022-03-21 DIAGNOSIS — I10 Essential (primary) hypertension: Secondary | ICD-10-CM | POA: Diagnosis not present

## 2022-03-21 DIAGNOSIS — F411 Generalized anxiety disorder: Secondary | ICD-10-CM | POA: Diagnosis not present

## 2022-03-23 DIAGNOSIS — H18622 Keratoconus, unstable, left eye: Secondary | ICD-10-CM | POA: Diagnosis not present

## 2022-03-30 DIAGNOSIS — H18622 Keratoconus, unstable, left eye: Secondary | ICD-10-CM | POA: Diagnosis not present

## 2022-06-25 DIAGNOSIS — Z1231 Encounter for screening mammogram for malignant neoplasm of breast: Secondary | ICD-10-CM | POA: Diagnosis not present

## 2022-06-25 DIAGNOSIS — H18462 Peripheral corneal degeneration, left eye: Secondary | ICD-10-CM | POA: Diagnosis not present

## 2022-07-10 DIAGNOSIS — R030 Elevated blood-pressure reading, without diagnosis of hypertension: Secondary | ICD-10-CM | POA: Diagnosis not present

## 2022-09-06 DIAGNOSIS — J3489 Other specified disorders of nose and nasal sinuses: Secondary | ICD-10-CM | POA: Diagnosis not present

## 2022-09-06 DIAGNOSIS — R051 Acute cough: Secondary | ICD-10-CM | POA: Diagnosis not present

## 2022-09-06 DIAGNOSIS — R0600 Dyspnea, unspecified: Secondary | ICD-10-CM | POA: Diagnosis not present

## 2022-10-10 DIAGNOSIS — I10 Essential (primary) hypertension: Secondary | ICD-10-CM | POA: Diagnosis not present

## 2022-10-10 DIAGNOSIS — Z1211 Encounter for screening for malignant neoplasm of colon: Secondary | ICD-10-CM | POA: Diagnosis not present

## 2022-10-10 DIAGNOSIS — F411 Generalized anxiety disorder: Secondary | ICD-10-CM | POA: Diagnosis not present

## 2022-10-10 DIAGNOSIS — K589 Irritable bowel syndrome without diarrhea: Secondary | ICD-10-CM | POA: Diagnosis not present

## 2022-11-21 DIAGNOSIS — E781 Pure hyperglyceridemia: Secondary | ICD-10-CM | POA: Diagnosis not present

## 2022-12-13 DIAGNOSIS — S134XXA Sprain of ligaments of cervical spine, initial encounter: Secondary | ICD-10-CM | POA: Diagnosis not present

## 2022-12-13 DIAGNOSIS — M542 Cervicalgia: Secondary | ICD-10-CM | POA: Diagnosis not present

## 2022-12-13 DIAGNOSIS — M5441 Lumbago with sciatica, right side: Secondary | ICD-10-CM | POA: Diagnosis not present

## 2022-12-17 DIAGNOSIS — L821 Other seborrheic keratosis: Secondary | ICD-10-CM | POA: Diagnosis not present

## 2022-12-17 DIAGNOSIS — D225 Melanocytic nevi of trunk: Secondary | ICD-10-CM | POA: Diagnosis not present

## 2022-12-17 DIAGNOSIS — L814 Other melanin hyperpigmentation: Secondary | ICD-10-CM | POA: Diagnosis not present

## 2022-12-17 DIAGNOSIS — Z08 Encounter for follow-up examination after completed treatment for malignant neoplasm: Secondary | ICD-10-CM | POA: Diagnosis not present

## 2023-03-13 DIAGNOSIS — F419 Anxiety disorder, unspecified: Secondary | ICD-10-CM | POA: Diagnosis not present

## 2023-03-13 DIAGNOSIS — F529 Unspecified sexual dysfunction not due to a substance or known physiological condition: Secondary | ICD-10-CM | POA: Diagnosis not present

## 2023-03-13 DIAGNOSIS — I10 Essential (primary) hypertension: Secondary | ICD-10-CM | POA: Diagnosis not present

## 2023-03-13 DIAGNOSIS — Z01419 Encounter for gynecological examination (general) (routine) without abnormal findings: Secondary | ICD-10-CM | POA: Diagnosis not present

## 2023-03-13 DIAGNOSIS — R6882 Decreased libido: Secondary | ICD-10-CM | POA: Diagnosis not present

## 2023-03-20 DIAGNOSIS — I10 Essential (primary) hypertension: Secondary | ICD-10-CM | POA: Diagnosis not present

## 2023-03-20 DIAGNOSIS — F411 Generalized anxiety disorder: Secondary | ICD-10-CM | POA: Diagnosis not present

## 2023-03-20 DIAGNOSIS — E781 Pure hyperglyceridemia: Secondary | ICD-10-CM | POA: Diagnosis not present

## 2023-05-01 DIAGNOSIS — H5213 Myopia, bilateral: Secondary | ICD-10-CM | POA: Diagnosis not present

## 2023-06-27 DIAGNOSIS — Z1231 Encounter for screening mammogram for malignant neoplasm of breast: Secondary | ICD-10-CM | POA: Diagnosis not present

## 2023-08-16 DIAGNOSIS — K6389 Other specified diseases of intestine: Secondary | ICD-10-CM | POA: Diagnosis not present

## 2023-08-16 DIAGNOSIS — Z1211 Encounter for screening for malignant neoplasm of colon: Secondary | ICD-10-CM | POA: Diagnosis not present

## 2023-08-16 DIAGNOSIS — K219 Gastro-esophageal reflux disease without esophagitis: Secondary | ICD-10-CM | POA: Diagnosis not present

## 2023-08-16 DIAGNOSIS — D120 Benign neoplasm of cecum: Secondary | ICD-10-CM | POA: Diagnosis not present

## 2023-08-16 DIAGNOSIS — K229 Disease of esophagus, unspecified: Secondary | ICD-10-CM | POA: Diagnosis not present

## 2023-08-16 DIAGNOSIS — K2930 Chronic superficial gastritis without bleeding: Secondary | ICD-10-CM | POA: Diagnosis not present

## 2023-08-16 DIAGNOSIS — K3189 Other diseases of stomach and duodenum: Secondary | ICD-10-CM | POA: Diagnosis not present

## 2023-09-25 DIAGNOSIS — R7309 Other abnormal glucose: Secondary | ICD-10-CM | POA: Diagnosis not present

## 2023-09-25 DIAGNOSIS — E781 Pure hyperglyceridemia: Secondary | ICD-10-CM | POA: Diagnosis not present

## 2023-09-25 DIAGNOSIS — B009 Herpesviral infection, unspecified: Secondary | ICD-10-CM | POA: Diagnosis not present

## 2023-09-25 DIAGNOSIS — I10 Essential (primary) hypertension: Secondary | ICD-10-CM | POA: Diagnosis not present

## 2023-09-25 DIAGNOSIS — F411 Generalized anxiety disorder: Secondary | ICD-10-CM | POA: Diagnosis not present

## 2023-10-21 ENCOUNTER — Other Ambulatory Visit (HOSPITAL_BASED_OUTPATIENT_CLINIC_OR_DEPARTMENT_OTHER): Payer: Self-pay | Admitting: Family Medicine

## 2023-10-21 DIAGNOSIS — E785 Hyperlipidemia, unspecified: Secondary | ICD-10-CM

## 2023-11-13 DIAGNOSIS — I10 Essential (primary) hypertension: Secondary | ICD-10-CM | POA: Diagnosis not present

## 2023-12-10 ENCOUNTER — Ambulatory Visit (HOSPITAL_BASED_OUTPATIENT_CLINIC_OR_DEPARTMENT_OTHER)
Admission: RE | Admit: 2023-12-10 | Discharge: 2023-12-10 | Disposition: A | Discharge status: Home / Self Care | Payer: Self-pay | Source: Ambulatory Visit | Attending: Family Medicine | Admitting: Family Medicine

## 2023-12-10 DIAGNOSIS — E785 Hyperlipidemia, unspecified: Secondary | ICD-10-CM | POA: Insufficient documentation

## 2023-12-16 DIAGNOSIS — H6691 Otitis media, unspecified, right ear: Secondary | ICD-10-CM | POA: Diagnosis not present

## 2023-12-16 DIAGNOSIS — H7291 Unspecified perforation of tympanic membrane, right ear: Secondary | ICD-10-CM | POA: Diagnosis not present

## 2024-01-09 DIAGNOSIS — H6122 Impacted cerumen, left ear: Secondary | ICD-10-CM | POA: Diagnosis not present

## 2024-01-09 DIAGNOSIS — H7291 Unspecified perforation of tympanic membrane, right ear: Secondary | ICD-10-CM | POA: Diagnosis not present

## 2024-01-09 DIAGNOSIS — H6980 Other specified disorders of Eustachian tube, unspecified ear: Secondary | ICD-10-CM | POA: Diagnosis not present

## 2024-01-15 DIAGNOSIS — L814 Other melanin hyperpigmentation: Secondary | ICD-10-CM | POA: Diagnosis not present

## 2024-01-15 DIAGNOSIS — D1801 Hemangioma of skin and subcutaneous tissue: Secondary | ICD-10-CM | POA: Diagnosis not present

## 2024-01-15 DIAGNOSIS — L821 Other seborrheic keratosis: Secondary | ICD-10-CM | POA: Diagnosis not present

## 2024-01-15 DIAGNOSIS — L858 Other specified epidermal thickening: Secondary | ICD-10-CM | POA: Diagnosis not present

## 2024-04-01 DIAGNOSIS — Z01419 Encounter for gynecological examination (general) (routine) without abnormal findings: Secondary | ICD-10-CM | POA: Diagnosis not present

## 2024-04-01 DIAGNOSIS — F411 Generalized anxiety disorder: Secondary | ICD-10-CM | POA: Diagnosis not present

## 2024-04-01 DIAGNOSIS — E781 Pure hyperglyceridemia: Secondary | ICD-10-CM | POA: Diagnosis not present

## 2024-04-01 DIAGNOSIS — I10 Essential (primary) hypertension: Secondary | ICD-10-CM | POA: Diagnosis not present
